# Patient Record
Sex: Male | Born: 1990 | Race: Black or African American | Hispanic: No | Smoking: Current every day smoker
Health system: Southern US, Community
[De-identification: ages and names within clinical notes are randomized; demographics above are authoritative.]

---

## 2016-08-21 ENCOUNTER — Emergency Department (HOSPITAL_COMMUNITY)
Admission: EM | Admit: 2016-08-21 | Discharge: 2016-08-21 | Disposition: A | Discharge status: Home / Self Care | Payer: Self-pay | Attending: Emergency Medicine | Admitting: Emergency Medicine

## 2016-08-21 ENCOUNTER — Encounter (HOSPITAL_COMMUNITY): Payer: Self-pay | Admitting: Emergency Medicine

## 2016-08-21 ENCOUNTER — Emergency Department (HOSPITAL_COMMUNITY): Payer: Self-pay

## 2016-08-21 DIAGNOSIS — Y999 Unspecified external cause status: Secondary | ICD-10-CM | POA: Insufficient documentation

## 2016-08-21 DIAGNOSIS — F17210 Nicotine dependence, cigarettes, uncomplicated: Secondary | ICD-10-CM | POA: Insufficient documentation

## 2016-08-21 DIAGNOSIS — Y939 Activity, unspecified: Secondary | ICD-10-CM | POA: Insufficient documentation

## 2016-08-21 DIAGNOSIS — S022XXA Fracture of nasal bones, initial encounter for closed fracture: Secondary | ICD-10-CM | POA: Insufficient documentation

## 2016-08-21 DIAGNOSIS — Y929 Unspecified place or not applicable: Secondary | ICD-10-CM | POA: Insufficient documentation

## 2016-08-21 MED ORDER — OXYMETAZOLINE HCL 0.05 % NA SOLN
1.0000 | Freq: Once | NASAL | Status: AC
  Administered 2016-08-21: 1 via NASAL
  Filled 2016-08-21: qty 15

## 2016-08-21 MED ORDER — ACETAMINOPHEN 500 MG PO TABS
1000.0000 mg | ORAL_TABLET | Freq: Once | ORAL | Status: AC
  Administered 2016-08-21: 1000 mg via ORAL
  Filled 2016-08-21: qty 2
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
## 2016-08-21 NOTE — Discharge Instructions (Signed)
Tylenol as needed for pain. Ice affected area for additional pain relief. Afrin twice daily as needed for nosebleed.  Call the ENT (ear,nose,throat) physician listed on Monday morning to schedule a follow up appointment.  Return to ER if nosebleed does not improve with direct pressure for 15 minutes and use of Afrin nose spray. You may also return to the ER for new or worsening symptoms, any additional concerns.
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
